# Patient Record
Sex: Male | Born: 1990 | Race: White | Hispanic: No | Marital: Single | Smoking: Never smoker
Health system: Southern US, Community
[De-identification: ages and names within clinical notes are randomized; demographics above are authoritative.]

## PROBLEM LIST (undated history)

## (undated) DIAGNOSIS — F952 Tourette's disorder: Secondary | ICD-10-CM

## (undated) DIAGNOSIS — J45909 Unspecified asthma, uncomplicated: Secondary | ICD-10-CM

## (undated) HISTORY — DX: Tourette's disorder: F95.2

## (undated) HISTORY — DX: Unspecified asthma, uncomplicated: J45.909

## (undated) HISTORY — PX: WISDOM TOOTH EXTRACTION: SHX21

---
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
## 2013-12-04 ENCOUNTER — Ambulatory Visit (INDEPENDENT_AMBULATORY_CARE_PROVIDER_SITE_OTHER): Payer: BC Managed Care – PPO | Admitting: Family Medicine

## 2013-12-04 ENCOUNTER — Encounter (INDEPENDENT_AMBULATORY_CARE_PROVIDER_SITE_OTHER): Payer: Self-pay

## 2013-12-04 ENCOUNTER — Encounter: Payer: Self-pay | Admitting: Family Medicine

## 2013-12-04 VITALS — BP 119/75 | HR 69 | Temp 97.6°F | Ht 68.5 in | Wt 174.6 lb

## 2013-12-04 DIAGNOSIS — H6983 Other specified disorders of Eustachian tube, bilateral: Secondary | ICD-10-CM

## 2013-12-04 DIAGNOSIS — H6980 Other specified disorders of Eustachian tube, unspecified ear: Secondary | ICD-10-CM

## 2013-12-23 NOTE — Progress Notes (Signed)
   Subjective:    Patient ID: Brandon Mann, male    DOB: 04/26/1991, 23 y.o.   MRN: [id number]  HPI  This 23 y.o. male presents for evaluation of bilateral ear discomfort.  Review of Systems    No chest pain, SOB, HA, dizziness, vision change, N/V, diarrhea, constipation, dysuria, urinary urgency or frequency, myalgias, arthralgias or rash.  Objective:   Physical Exam Vital signs noted  Well developed well nourished male.  HEENT - Head atraumatic Normocephalic                Eyes - PERRLA, Conjuctiva - clear Sclera- Clear EOMI                Ears - EAC's Wnl TM's Wnl Gross Hearing WNL                Nose - Nares patent                 Throat - oropharanx wnl Respiratory - Lungs CTA bilateral Cardiac - RRR S1 and S2 without murmur GI - Abdomen soft Nontender and bowel sounds active x 4 Extremities - No edema. Neuro - Grossly intact.       Assessment & Plan:  ETD (eustachian tube dysfunction), bilateral Push po fluids, rest, tylenol and motrin otc prn as directed for fever, arthralgias, and myalgias.  Follow up prn if sx's continue or persist.  Deatra CanterWilliam J Talan Gildner FNP

## 2013-12-24 ENCOUNTER — Encounter: Payer: Self-pay | Admitting: Family Medicine

## 2013-12-24 ENCOUNTER — Ambulatory Visit (INDEPENDENT_AMBULATORY_CARE_PROVIDER_SITE_OTHER): Payer: BC Managed Care – PPO | Admitting: Family Medicine

## 2013-12-24 VITALS — BP 116/70 | HR 113 | Temp 97.8°F | Ht 68.5 in | Wt 175.8 lb

## 2013-12-24 DIAGNOSIS — J029 Acute pharyngitis, unspecified: Secondary | ICD-10-CM

## 2013-12-24 MED ORDER — AZITHROMYCIN 250 MG PO TABS: ORAL_TABLET | ORAL | Status: DC

## 2013-12-24 NOTE — Progress Notes (Signed)
   Subjective:    Patient ID: Brandon Mann, male    DOB: 08/21/1991, 23 y.o.   MRN: [id number]  HPI  This 23 y.o. male presents for evaluation of uri symptoms and sore throat.  Review of Systems C/o sore throat   No chest pain, SOB, HA, dizziness, vision change, N/V, diarrhea, constipation, dysuria, urinary urgency or frequency, myalgias, arthralgias or rash.  Objective:   Physical Exam Vital signs noted  Well developed well nourished male.  HEENT - Head atraumatic Normocephalic                Eyes - PERRLA, Conjuctiva - clear Sclera- Clear EOMI                Ears - EAC's Wnl TM's Wnl Gross Hearing WNL                Throat - oropharanx injected Respiratory - Lungs CTA bilateral Cardiac - RRR S1 and S2 without murmur GI - Abdomen soft Nontender and bowel sounds active x 4       Assessment & Plan:  Acute pharyngitis - Plan: azithromycin (ZITHROMAX) 250 MG tablet Push po fluids, rest, tylenol and motrin otc prn as directed for fever, arthralgias, and myalgias.  Follow up prn if sx's continue or persist. Deatra CanterWilliam J Naasir Carreira FNP

## 2017-05-18 ENCOUNTER — Ambulatory Visit: Payer: BC Managed Care – PPO | Admitting: Family Medicine

## 2019-03-10 ENCOUNTER — Telehealth (INDEPENDENT_AMBULATORY_CARE_PROVIDER_SITE_OTHER): Payer: Self-pay | Admitting: Physician Assistant

## 2019-03-10 ENCOUNTER — Encounter (INDEPENDENT_AMBULATORY_CARE_PROVIDER_SITE_OTHER): Payer: Self-pay | Admitting: Physician Assistant

## 2019-03-10 DIAGNOSIS — J029 Acute pharyngitis, unspecified: Secondary | ICD-10-CM

## 2019-03-10 DIAGNOSIS — J069 Acute upper respiratory infection, unspecified: Secondary | ICD-10-CM

## 2019-03-10 MED ORDER — AMOXICILLIN 500 MG PO CAPS: 500.0000 mg | ORAL_CAPSULE | Freq: Two times a day (BID) | ORAL | 0 refills | Status: DC

## 2019-03-10 MED ORDER — BENZONATATE 100 MG PO CAPS: 100.0000 mg | ORAL_CAPSULE | Freq: Three times a day (TID) | ORAL | 0 refills | Status: DC | PRN

## 2019-03-10 NOTE — Progress Notes (Signed)
We are sorry that you are not feeling well.  Here is how we plan to help!  Based on what you have shared with me it is likely that you have strep pharyngitis.  Strep pharyngitis is inflammation and infection in the back of the throat.    You current sore throat may not be bacterial in nature, however, due to worsening symptoms, pain with swallowing, and involvement of lymph nodes, I will treat with antibiotics.    This is an infection cause by bacteria and is treated with antibiotics.  I have prescribed Amoxicillin 500 mg one pill twice daily for 10 days.   I have also prescribed medicine for your cough, Tessalon 100 mg take 1-2 pills three times daily as needed.    For throat pain, we recommend over the counter oral pain relief medications such as acetaminophen or aspirin, or anti-inflammatory medications such as ibuprofen or naproxen sodium. Topical treatments such as oral throat lozenges or sprays may be used as needed. Strep infections are not as easily transmitted as other respiratory infections, however we still recommend that you avoid close contact with loved ones, especially the very young and elderly.  Remember to wash your hands thoroughly throughout the day as this is the number one way to prevent the spread of infection and wipe down door knobs and counters with disinfectant.   Home Care:  Only take medications as instructed by your medical team.  Complete the entire course of an antibiotic.  Do not take these medications with alcohol.  A steam or ultrasonic humidifier can help congestion.  You can place a towel over your head and breathe in the steam from hot water coming from a faucet.  Avoid close contacts especially the very young and the elderly.  Cover your mouth when you cough or sneeze.  Always remember to wash your hands.  Get Help Right Away If:  You develop worsening fever or sinus pain.  You develop a severe head ache or visual changes.  Your symptoms  persist after you have completed your treatment plan.  Make sure you  Understand these instructions.  Will watch your condition.  Will get help right away if you are not doing well or get worse.  Your e-visit answers were reviewed by a board certified advanced clinical practitioner to complete your personal care plan.  Depending on the condition, your plan could have included both over the counter or prescription medications.  If there is a problem please reply  once you have received a response from your provider.  Your safety is important to [place].  If you have drug allergies check your prescription carefully.    You can use MyChart to ask questions about today's visit, request a non-urgent call back, or ask for a work or school excuse for 24 hours related to this e-Visit. If it has been greater than 24 hours you will need to follow up with your provider, or enter a new e-Visit to address those concerns.  You will get an e-mail in the next two days asking about your experience.  I hope that your e-visit has been valuable and will speed your recovery. Thank you for using e-visits.   I have spent 7 min in completion and review of this note- Illa Level [facility]

## 2020-08-11 ENCOUNTER — Other Ambulatory Visit: Payer: Self-pay

## 2020-08-11 ENCOUNTER — Emergency Department (HOSPITAL_COMMUNITY)
Admission: EM | Admit: 2020-08-11 | Discharge: 2020-08-12 | Disposition: A | Discharge status: Home / Self Care | Payer: Managed Care, Other (non HMO) | Attending: Emergency Medicine | Admitting: Emergency Medicine

## 2020-08-11 ENCOUNTER — Encounter (HOSPITAL_COMMUNITY): Payer: Self-pay | Admitting: Emergency Medicine

## 2020-08-11 DIAGNOSIS — K625 Hemorrhage of anus and rectum: Secondary | ICD-10-CM | POA: Insufficient documentation

## 2020-08-11 DIAGNOSIS — Z79899 Other long term (current) drug therapy: Secondary | ICD-10-CM | POA: Diagnosis not present

## 2020-08-11 DIAGNOSIS — J45909 Unspecified asthma, uncomplicated: Secondary | ICD-10-CM | POA: Diagnosis not present

## 2020-08-11 NOTE — ED Triage Notes (Signed)
Pt c/o rectal bleeding that started at 1100 this morning.

## 2020-08-12 ENCOUNTER — Encounter: Payer: Self-pay | Admitting: Family Medicine

## 2020-08-12 LAB — COMPREHENSIVE METABOLIC PANEL
ALT: 12 U/L (ref 0–44)
AST: 14 U/L — ABNORMAL LOW (ref 15–41)
Albumin: 4.2 g/dL (ref 3.5–5.0)
Alkaline Phosphatase: 76 U/L (ref 38–126)
Anion gap: 10 (ref 5–15)
BUN: 17 mg/dL (ref 6–20)
CO2: 27 mmol/L (ref 22–32)
Calcium: 9.3 mg/dL (ref 8.9–10.3)
Chloride: 102 mmol/L (ref 98–111)
Creatinine, Ser: 0.78 mg/dL (ref 0.61–1.24)
GFR calc Af Amer: >60 mL/min (ref >60)
GFR calc non Af Amer: >60 mL/min (ref >60)
Glucose, Bld: 100 mg/dL — ABNORMAL HIGH (ref 70–99)
Potassium: 3.7 mmol/L (ref 3.5–5.1)
Sodium: 139 mmol/L (ref 135–145)
Total Bilirubin: 0.4 mg/dL (ref 0.3–1.2)
Total Protein: 6.9 g/dL (ref 6.5–8.1)

## 2020-08-12 LAB — CBC WITH DIFFERENTIAL/PLATELET
Abs Immature Granulocytes: 0.01 10*3/uL (ref 0.00–0.07)
Basophils Absolute: 0.1 10*3/uL (ref 0.0–0.1)
Basophils Relative: 1 %
Eosinophils Absolute: 0.3 10*3/uL (ref 0.0–0.5)
Eosinophils Relative: 4 %
HCT: 42.6 % (ref 39.0–52.0)
Hemoglobin: 14.2 g/dL (ref 13.0–17.0)
Immature Granulocytes: 0 %
Lymphocytes Relative: 30 %
Lymphs Abs: 2.3 10*3/uL (ref 0.7–4.0)
MCH: 29.8 pg (ref 26.0–34.0)
MCHC: 33.3 g/dL (ref 30.0–36.0)
MCV: 89.3 fL (ref 80.0–100.0)
Monocytes Absolute: 0.6 10*3/uL (ref 0.1–1.0)
Monocytes Relative: 7 %
Neutro Abs: 4.5 10*3/uL (ref 1.7–7.7)
Neutrophils Relative %: 58 %
Platelets: 206 10*3/uL (ref 150–400)
RBC: 4.77 MIL/uL (ref 4.22–5.81)
RDW: 12.8 % (ref 11.5–15.5)
WBC: 7.7 10*3/uL (ref 4.0–10.5)
nRBC: 0 % (ref 0.0–0.2)

## 2020-08-12 LAB — POC OCCULT BLOOD, ED: Fecal Occult Bld: POSITIVE — AB

## 2020-08-12 MED ORDER — HYDROCORTISONE ACETATE 25 MG RE SUPP: 25.0000 mg | Freq: Two times a day (BID) | RECTAL | 0 refills | Status: AC

## 2020-08-12 NOTE — Discharge Instructions (Addendum)
Use the suppositories as directed.  Please call Dr. Patty Sermons office, the gastroenterologist on-call to get a follow-up evaluation for rectal bleeding.  Return to the emergency department if you get fever, abdominal pain, you start passing blood from your rectum multiple times or you get weak or dizzy.

## 2020-08-12 NOTE — ED Provider Notes (Signed)
[facility] EMERGENCY DEPARTMENT Provider Note   CSN: [id number] Arrival date & time: 08/11/20  2334   Time seen 12:50 AM  History Chief Complaint  Patient presents with  . Rectal Bleeding    Brandon Mann is a 29 y.o. male.  HPI   Patient states about 11:10 PM tonight after getting off work he had to stop at a local gas station on the way home to have a bowel movement.  He states when he wiped he saw blood and when he looked in the toilet it was full of blood.  He is not sure if he had a normal bowel movement or not.  He denies having any abdominal pain earlier today or currently.  He states he has been seeing blood when he wipes occasionally for years.  He only sees blood on the tissue, not in the toilet or on the stool.  He denies nausea, vomiting, feeling dizzy or lightheaded.  He does have a history of GERD and has it often.  He states he takes Tums for it.  He denies any family history of inflammatory bowel disease.  He states he has never had this amount of bleeding before.  PCP Dettinger, Elige Radon, MD   Past Medical History:  Diagnosis Date  . Asthma     There are no problems to display for this patient.   Past Surgical History:  Procedure Laterality Date  . WISDOM TOOTH EXTRACTION Bilateral        No family history on file.  Social History   Tobacco Use  . Smoking status: Passive Smoke Exposure - Never Smoker  . Smokeless tobacco: Never Used  Substance Use Topics  . Alcohol use: Yes    Comment: occasionally  . Drug use: No  employed Here with significant other  Home Medications Prior to Admission medications   Medication Sig Start Date End Date Taking? Authorizing Provider  amoxicillin (AMOXIL) 500 MG capsule Take 1 capsule (500 mg total) by mouth 2 (two) times daily. 03/10/19   Demetrio Lapping, PA-C  azithromycin (ZITHROMAX) 250 MG tablet Take 2 po first day and then one po qd x 4 day 12/24/13   Deatra Canter, FNP  benzonatate (TESSALON) 100 MG  capsule Take 1-2 capsules (100-200 mg total) by mouth 3 (three) times daily as needed for cough. 03/10/19   Demetrio Lapping, PA-C  hydrocortisone (ANUSOL-HC) 25 MG suppository Place 1 suppository (25 mg total) rectally 2 (two) times daily. 08/12/20   Devoria Albe, MD    Allergies    Shellfish allergy  Review of Systems   Review of Systems  All other systems reviewed and are negative.   Physical Exam Updated Vital Signs BP 109/64   Pulse 66   Ht 5\' 8"  (1.727 m)   Wt 81.6 kg   SpO2 99%   BMI 27.37 kg/m   Physical Exam Vitals and nursing note reviewed.  Constitutional:      General: He is not in acute distress.    Appearance: Normal appearance. He is normal weight. He is not ill-appearing or toxic-appearing.     Comments: Patient appears to have some tics during his exam.  He has some facial twitching.  HENT:     Head: Normocephalic and atraumatic.     Right Ear: External ear normal.     Left Ear: External ear normal.  Eyes:     Extraocular Movements: Extraocular movements intact.     Conjunctiva/sclera: Conjunctivae normal.  Pupils: Pupils are equal, round, and reactive to light.  Cardiovascular:     Rate and Rhythm: Normal rate and regular rhythm.     Pulses: Normal pulses.     Heart sounds: Normal heart sounds.  Pulmonary:     Effort: Pulmonary effort is normal. No respiratory distress.     Breath sounds: Normal breath sounds.  Musculoskeletal:        General: Normal range of motion.     Cervical back: Normal range of motion.  Skin:    General: Skin is warm and dry.  Neurological:     General: No focal deficit present.     Mental Status: He is alert and oriented to person, place, and time.     Cranial Nerves: No cranial nerve deficit.  Psychiatric:        Mood and Affect: Mood normal.        Behavior: Behavior normal.        Thought Content: Thought content normal.    Orthostatic VS for the past 24 hrs:  BP- Lying Pulse- Lying BP- Sitting Pulse- Sitting BP-  Standing at 0 minutes Pulse- Standing at 0 minutes  08/12/20 0107 111/68 61 107/79 61 111/82 64      ED Results / Procedures / Treatments   Labs (all labs ordered are listed, but only abnormal results are displayed) Results for orders placed or performed during the hospital encounter of 08/11/20  Comprehensive metabolic panel  Result Value Ref Range   Sodium 139 135 - 145 mmol/L   Potassium 3.7 3.5 - 5.1 mmol/L   Chloride 102 98 - 111 mmol/L   CO2 27 22 - 32 mmol/L   Glucose, Bld 100 (H) 70 - 99 mg/dL   BUN 17 6 - 20 mg/dL   Creatinine, Ser 4.70 0.61 - 1.24 mg/dL   Calcium 9.3 8.9 - 96.2 mg/dL   Total Protein 6.9 6.5 - 8.1 g/dL   Albumin 4.2 3.5 - 5.0 g/dL   AST 14 (L) 15 - 41 U/L   ALT 12 0 - 44 U/L   Alkaline Phosphatase 76 38 - 126 U/L   Total Bilirubin 0.4 0.3 - 1.2 mg/dL   GFR calc non Af Amer >60 >60 mL/min   GFR calc Af Amer >60 >60 mL/min   Anion gap 10 5 - 15  CBC with Differential  Result Value Ref Range   WBC 7.7 4.0 - 10.5 K/uL   RBC 4.77 4.22 - 5.81 MIL/uL   Hemoglobin 14.2 13.0 - 17.0 g/dL   HCT 83.6 39 - 52 %   MCV 89.3 80.0 - 100.0 fL   MCH 29.8 26.0 - 34.0 pg   MCHC 33.3 30.0 - 36.0 g/dL   RDW 62.9 47.6 - 54.6 %   Platelets 206 150 - 400 K/uL   nRBC 0.0 0.0 - 0.2 %   Neutrophils Relative % 58 %   Neutro Abs 4.5 1.7 - 7.7 K/uL   Lymphocytes Relative 30 %   Lymphs Abs 2.3 0.7 - 4.0 K/uL   Monocytes Relative 7 %   Monocytes Absolute 0.6 0 - 1 K/uL   Eosinophils Relative 4 %   Eosinophils Absolute 0.3 0 - 0 K/uL   Basophils Relative 1 %   Basophils Absolute 0.1 0 - 0 K/uL   Immature Granulocytes 0 %   Abs Immature Granulocytes 0.01 0.00 - 0.07 K/uL  POC occult blood, ED RN will collect  Result Value Ref Range   Fecal Occult Bld  POSITIVE (A) NEGATIVE   .Laboratory interpretation all normal     EKG None  Radiology No results found.  Procedures Procedures (including critical care time)  Medications Ordered in ED Medications - No data to  display  ED Course  I have reviewed the triage vital signs and the nursing notes.  Pertinent labs & imaging results that were available during my care of the patient were reviewed by me and considered in my medical decision making (see chart for details).    MDM Rules/Calculators/A&P                          Nurses report Hemoccult was grossly positive visually for blood.  Laboratory testing was done.   Patient's hemoglobin is normal.  His orthostatic vital signs are normal.  Patient was observed in the ED for several hours.  When I rechecked him at 3:30 AM he states he went back to the bathroom and had one episode of small liquid stool without gross blood being visualized.  However he did notice some blood when he wiped.  We discussed he probably has some internal hemorrhoids.  He was discharged home with Anusol HC suppositories.  We also discussed the need to follow-up with gastroenterology.  He was should return for abdominal pain,, multiple episodes of rectal bleeding, or if he starts feeling dizzy or weak.    Final Clinical Impression(s) / ED Diagnoses Final diagnoses:  Rectal bleeding    Rx / DC Orders ED Discharge Orders         Ordered    hydrocortisone (ANUSOL-HC) 25 MG suppository  2 times daily        08/12/20 0359         Plan discharge  Devoria Albe, MD, Concha Pyo, MD 08/12/20 [phone]

## 2020-08-18 ENCOUNTER — Encounter (INDEPENDENT_AMBULATORY_CARE_PROVIDER_SITE_OTHER): Payer: Self-pay | Admitting: Gastroenterology

## 2020-09-16 ENCOUNTER — Encounter (INDEPENDENT_AMBULATORY_CARE_PROVIDER_SITE_OTHER): Payer: Self-pay | Admitting: Gastroenterology

## 2020-09-16 ENCOUNTER — Ambulatory Visit (INDEPENDENT_AMBULATORY_CARE_PROVIDER_SITE_OTHER): Payer: Managed Care, Other (non HMO) | Admitting: Gastroenterology

## 2020-09-16 ENCOUNTER — Other Ambulatory Visit: Payer: Self-pay

## 2020-09-16 DIAGNOSIS — K625 Hemorrhage of anus and rectum: Secondary | ICD-10-CM | POA: Insufficient documentation

## 2020-09-16 NOTE — Progress Notes (Signed)
29 Brandon Mann, M.D. Gastroenterology & Hepatology [facility] [address] [place], [place] [address] Primary Care Physician: Dettinger, Elige Radon, MD [address] [place] [address]  Referring MD: PCP  I will communicate my assessment and recommendations to the referring MD via EMR. Note: Occasional unusual wording and randomly placed punctuation marks may result from the use of speech recognition technology to transcribe this document"  Chief Complaint: Rectal bleeding  History of Present Illness: Brandon Mann is a 29 y.o. male with asthma and Tourette syndrome, who presents for evaluation of rectal bleeding.  Patient reports that he had an acute episode of large rectal bleeding without clots during the night of 08/11/2020.  The patient brings a picture of the toilet which was full of blood.  He did not see any stool in the toilet at that time.  He did not have any urgency or tenesmus when this happened. He states having some dyschezia when he had the bowel movement but denied having any more symptoms.  States for a few more days afterwards he saw some blood when wiping but it eventually resolved . Due to these episodes, the patient came the same day to the ER where he was evaluated by the staff at [facility].  He had CBC and CMP checked which were within normal limits with a normal hemoglobin of 14.2.  The patient was counseled to follow with gastroenterology and he was told to take Anusol suppositories.  Patient states that he only use it once as he was not able to retain the suppository.  He reports that after that he remained constipated for a few days after the initial event, but now he is back to normal having a BM almost every day. His stool is now brown.    He endorses having some fresh blood when wiping intermittently (months in between) for the last couple of years but he never saw the amount of blood as in his most  recent episode. He reports sometimes he has to strain to have a BM, most of his BMs are solid.  The patient denies having any nausea, vomiting, fever, chills, melena, hematemesis, abdominal distention, abdominal pain, diarrhea, jaundice, pruritus or weight loss.  Last QPR:FFMBW Last Colonoscopy:never  FHx: neg for any gastrointestinal/liver disease, no malignancies Social: neg smoking, alcohol or illicit drug use Surgical: non contributory  Past Medical History: Past Medical History:  Diagnosis Date  . Asthma   . Tourette's syndrome     Past Surgical History: Past Surgical History:  Procedure Laterality Date  . WISDOM TOOTH EXTRACTION Bilateral     Family History: Family History  Problem Relation Age of Onset  . Diabetes Mother   . Diabetes Father   . Healthy Brother   . Healthy Brother     Social History: Social History   Tobacco Use  Smoking Status Never Smoker  Smokeless Tobacco Never Used   Social History   Substance and Sexual Activity  Alcohol Use Yes   Comment: occasionally   Social History   Substance and Sexual Activity  Drug Use No    Allergies: Allergies  Allergen Reactions  . Shellfish Allergy     Medications: Current Outpatient Medications  Medication Sig Dispense Refill  . hydrocortisone (ANUSOL-HC) 25 MG suppository Place 1 suppository (25 mg total) rectally 2 (two) times daily. (Patient not taking: Reported on 09/16/2020) 12 suppository 0   No current facility-administered medications for this visit.  Review of Systems: GENERAL: negative for malaise, night sweats HEENT: No changes in hearing or vision, no nose bleeds or other nasal problems. NECK: Negative for lumps, goiter, pain and significant neck swelling RESPIRATORY: Negative for cough, wheezing CARDIOVASCULAR: Negative for chest pain, leg swelling, palpitations, orthopnea GI: SEE HPI MUSCULOSKELETAL: Negative for joint pain or swelling, back pain, and muscle pain. SKIN:  Negative for lesions, rash PSYCH: Negative for sleep disturbance, mood disorder and recent psychosocial stressors. HEMATOLOGY Negative for prolonged bleeding, bruising easily, and swollen nodes. ENDOCRINE: Negative for cold or heat intolerance, polyuria, polydipsia and goiter. NEURO: negative for tremor, gait imbalance, syncope and seizures. The remainder of the review of systems is noncontributory.   Physical Exam: BP 118/81 (BP Location: Right Arm, Patient Position: Sitting, Cuff Size: Normal)   Pulse 98   Temp 98.6 F (37 C) (Oral)   Ht 5\' 8"  (1.727 m)   Wt 185 lb 12.8 oz (84.3 kg)   BMI 28.25 kg/m  GENERAL: The patient is AO x3, in no acute distress. HEENT: Head is normocephalic and atraumatic. EOMI are intact. Mouth is well hydrated and without lesions. NECK: Supple. No masses LUNGS: Clear to auscultation. No presence of rhonchi/wheezing/rales. Adequate chest expansion HEART: RRR, normal s1 and s2. ABDOMEN: Soft, nontender, no guarding, no peritoneal signs, and nondistended. BS +. No masses. RECTAL EXAM: deferred EXTREMITIES: Without any cyanosis, clubbing, rash, lesions or edema. NEUROLOGIC: AOx3, no focal motor deficit. SKIN: no jaundice, no rashes   Imaging/Labs: as above  I personally reviewed and interpreted the available labs, imaging and endoscopic files.  Impression and Plan: [name] is a 29 y.o. male with asthma and Tourette syndrome, who presents for evaluation of rectal bleeding.  The patient had an episode of large rectal bleeding but has not presented any red flag signs.  He has presented these episodes intermittently along with significant straining when having a bowel movement.  His symptoms are likely related to internal hemorrhoids, unlikely related to malignancy or other disorders as he has not presented any red flag signs and does not have any other risk factors.  Conservative management with MiraLAX to soften his bowel movements is warranted at  this point.  The patient was counseled to call back to consider a colonoscopy if he has recurrent episodes of rectal bleeding. Patient understood and agreed.  - Start taking Miralax daily - Call back if rectal bleeding recurs  All questions were answered.      27, MD Gastroenterology and Hepatology [facility]

## 2020-09-16 NOTE — Patient Instructions (Signed)
Start taking Miralax 1 cap every day for one week. If bowel movements do not improve, increase to 1 cup every 12 hours. If after two weeks there is no improvement, increase to 1 cup every 8 hours Call back if you have recurrent episodes of rectal bleeding

## 2023-11-01 ENCOUNTER — Telehealth (HOSPITAL_COMMUNITY): Payer: Self-pay

## 2023-11-01 NOTE — Telephone Encounter (Signed)
Lvm to confirm 11/05/23 appt by 12:00 11/02/23

## 2023-11-02 NOTE — Telephone Encounter (Signed)
Called pt no answer left vm cancelling appt

## 2023-11-05 ENCOUNTER — Ambulatory Visit (HOSPITAL_COMMUNITY): Payer: BC Managed Care – PPO | Admitting: Psychiatry
